# Patient Record
Sex: Male | Born: 1980 | Hispanic: No | Marital: Single | Smoking: Current every day smoker
Health system: Southern US, Community
[De-identification: ages and names within clinical notes are randomized; demographics above are authoritative.]

## PROBLEM LIST (undated history)

## (undated) HISTORY — PX: KNEE SURGERY: SHX244

---

## 2018-12-18 ENCOUNTER — Emergency Department (HOSPITAL_COMMUNITY)
Admission: EM | Admit: 2018-12-18 | Discharge: 2018-12-18 | Disposition: A | Discharge status: Home / Self Care | Payer: No Typology Code available for payment source | Attending: Emergency Medicine | Admitting: Emergency Medicine

## 2018-12-18 ENCOUNTER — Encounter (HOSPITAL_COMMUNITY): Payer: Self-pay

## 2018-12-18 ENCOUNTER — Other Ambulatory Visit: Payer: Self-pay

## 2018-12-18 ENCOUNTER — Emergency Department (HOSPITAL_COMMUNITY): Payer: No Typology Code available for payment source

## 2018-12-18 DIAGNOSIS — S39012A Strain of muscle, fascia and tendon of lower back, initial encounter: Secondary | ICD-10-CM

## 2018-12-18 DIAGNOSIS — F17210 Nicotine dependence, cigarettes, uncomplicated: Secondary | ICD-10-CM | POA: Insufficient documentation

## 2018-12-18 DIAGNOSIS — S3992XA Unspecified injury of lower back, initial encounter: Secondary | ICD-10-CM | POA: Diagnosis present

## 2018-12-18 DIAGNOSIS — M5441 Lumbago with sciatica, right side: Secondary | ICD-10-CM | POA: Diagnosis not present

## 2018-12-18 DIAGNOSIS — M5431 Sciatica, right side: Secondary | ICD-10-CM

## 2018-12-18 DIAGNOSIS — Y929 Unspecified place or not applicable: Secondary | ICD-10-CM | POA: Insufficient documentation

## 2018-12-18 DIAGNOSIS — W010XXA Fall on same level from slipping, tripping and stumbling without subsequent striking against object, initial encounter: Secondary | ICD-10-CM | POA: Insufficient documentation

## 2018-12-18 DIAGNOSIS — Y9389 Activity, other specified: Secondary | ICD-10-CM | POA: Insufficient documentation

## 2018-12-18 DIAGNOSIS — Y990 Civilian activity done for income or pay: Secondary | ICD-10-CM | POA: Diagnosis not present

## 2018-12-18 MED ORDER — DEXAMETHASONE SODIUM PHOSPHATE 10 MG/ML IJ SOLN
10.0000 mg | Freq: Once | INTRAMUSCULAR | Status: AC
  Administered 2018-12-18: 10 mg via INTRAMUSCULAR
  Filled 2018-12-18: qty 1

## 2018-12-18 MED ORDER — KETOROLAC TROMETHAMINE 60 MG/2ML IM SOLN
60.0000 mg | Freq: Once | INTRAMUSCULAR | Status: AC
  Administered 2018-12-18: 60 mg via INTRAMUSCULAR
  Filled 2018-12-18: qty 2

## 2018-12-18 MED ORDER — PREDNISONE 50 MG PO TABS: 50.0000 mg | ORAL_TABLET | Freq: Every day | ORAL | 0 refills | Status: DC

## 2018-12-18 MED ORDER — HYDROCODONE-ACETAMINOPHEN 5-325 MG PO TABS
1.0000 | ORAL_TABLET | Freq: Once | ORAL | Status: AC
  Administered 2018-12-18: 1 via ORAL
  Filled 2018-12-18: qty 1

## 2018-12-18 MED ORDER — CYCLOBENZAPRINE HCL 10 MG PO TABS: 10.0000 mg | ORAL_TABLET | Freq: Every day | ORAL | 0 refills | Status: AC

## 2018-12-18 MED ORDER — TRAMADOL HCL 50 MG PO TABS: 50.0000 mg | ORAL_TABLET | Freq: Four times a day (QID) | ORAL | 0 refills | Status: DC | PRN

## 2018-12-18 NOTE — Discharge Instructions (Signed)
Return here as needed.  You will need to follow-up with your Workmen's Comp. for further outpatient follow-up.  Your x-rays did not show any abnormalities at this time.  Use ice and heat on your lower back.

## 2018-12-18 NOTE — ED Triage Notes (Addendum)
Patient c/o mid lower back pain x 1 week ago. Patient states he had lifted 2 big packages and stepped on a piece of cardboard, hit his neck on a post and fell on his back. Pain radiates into the right buttock. Patient states when he lifts his left leg, the pain is worse.

## 2018-12-18 NOTE — ED Provider Notes (Signed)
[facility]-EMERGENCY DEPT Provider Note   CSN: [id number] Arrival date & time: 12/18/18  1028     History   Chief Complaint Chief Complaint  Patient presents with  . Back Pain    HPI Brian Jarvis is a 38 y.o. male.     HPI Patient presents to the emergency department with lower back pain that started 1 week ago after slipping and falling at work.  The patient states he was carrying 2 heavy boxes when he slipped on a piece of cardboard.  Patient states that he is having pain in the right lower back that radiates into the right leg.  She states certain movements and palpation make the pain worse.  The patient states he did not take any medications prior to arrival for his symptoms.  Patient denies any numbness, weakness, dizziness, headache, blurred vision, lower extremity edema, near-syncope or syncope.  She denies any other injuries at this time. History reviewed. No pertinent past medical history.  There are no active problems to display for this patient.   Past Surgical History:  Procedure Laterality Date  . KNEE SURGERY Right         Home Medications    Prior to Admission medications   Not on File    Family History Family History  Family history unknown: Yes    Social History Social History   Tobacco Use  . Smoking status: Current Every Day Smoker    Packs/day: 0.25    Types: Cigarettes  . Smokeless tobacco: Never Used  Substance Use Topics  . Alcohol use: Yes  . Drug use: Never     Allergies   Patient has no known allergies.   Review of Systems Review of Systems All other systems negative except as documented in the HPI. All pertinent positives and negatives as reviewed in the HPI. Physical Exam Updated Vital Signs BP (!) 123/91 (BP Location: Left Arm)   Pulse 71   Temp 98.2 F (36.8 C) (Oral)   Resp 18   Ht 5\' 9"  (1.753 m)   Wt 97.5 kg   SpO2 100%   BMI 31.75 kg/m   Physical Exam Vitals signs and nursing  note reviewed.  Constitutional:      General: He is not in acute distress.    Appearance: He is well-developed.  HENT:     Head: Normocephalic and atraumatic.  Eyes:     Pupils: Pupils are equal, round, and reactive to light.  Neck:     Musculoskeletal: Normal range of motion and neck supple.  Cardiovascular:     Rate and Rhythm: Normal rate and regular rhythm.     Heart sounds: Normal heart sounds. No murmur. No friction rub. No gallop.   Pulmonary:     Effort: Pulmonary effort is normal. No respiratory distress.     Breath sounds: Normal breath sounds. No wheezing.  Musculoskeletal:     Lumbar back: He exhibits decreased range of motion, tenderness, pain and spasm. He exhibits no bony tenderness, no swelling and no deformity.       Back:  Skin:    General: Skin is warm and dry.     Capillary Refill: Capillary refill takes less than 2 seconds.     Findings: No erythema or rash.  Neurological:     Mental Status: He is alert and oriented to person, place, and time.     Motor: No abnormal muscle tone.     Coordination: Coordination normal.  Psychiatric:  Behavior: Behavior normal.      ED Treatments / Results  Labs (all labs ordered are listed, but only abnormal results are displayed) Labs Reviewed - No data to display  EKG None  Radiology Dg Lumbar Spine Complete  Result Date: 12/18/2018 CLINICAL DATA:  Pain following fall EXAM: LUMBAR SPINE - COMPLETE 4+ VIEW COMPARISON:  None. FINDINGS: Frontal, lateral, spot lumbosacral lateral, and bilateral oblique views were obtained. There are 5 non-rib-bearing lumbar type vertebral bodies. There is no fracture or spondylolisthesis. The disc spaces appear unremarkable. There is no appreciable facet arthropathy. IMPRESSION: No fracture or spondylolisthesis.  No appreciable arthropathy. Electronically Signed   By: Lowella Grip III M.D.   On: 12/18/2018 12:43    Procedures Procedures (including critical care time)   Medications Ordered in ED Medications  ketorolac (TORADOL) injection 60 mg (60 mg Intramuscular Given 12/18/18 1156)  dexamethasone (DECADRON) injection 10 mg (10 mg Intramuscular Given 12/18/18 1156)  HYDROcodone-acetaminophen (NORCO/VICODIN) 5-325 MG per tablet 1 tablet (1 tablet Oral Given 12/18/18 1156)     Initial Impression / Assessment and Plan / ED Course  I have reviewed the triage vital signs and the nursing notes.  Pertinent labs & imaging results that were available during my care of the patient were reviewed by me and considered in my medical decision making (see chart for details).        Patient has normal deep tendon reflexes.  Patient has normal strength and range of motion in his lower extremities.  The patient has normal sensation as well.  I have advised the patient to follow-up with his Workmen's Comp. for further referral.  The patient states that he understands the plan and all questions were answered. Final Clinical Impressions(s) / ED Diagnoses   Final diagnoses:  None    ED Discharge Orders    None       Dalia Heading, PA-C 12/18/18 1300    Lacretia Leigh, MD 12/18/18 1443

## 2019-03-17 ENCOUNTER — Other Ambulatory Visit: Payer: Self-pay | Admitting: Orthopedic Surgery

## 2019-03-18 NOTE — Pre-Procedure Instructions (Addendum)
[id number] - [place], [place] - [address] [place] [address] Phone: [phone] Fax: [phone]     Your procedure is scheduled on Wednesday March 3rd.  Report to [facility] Main Entrance "A" at 9:00 A.M., and check in at the Admitting office.  Call this number if you have problems the morning of surgery:  [phone]  Call [phone] if you have any questions prior to your surgery date Monday-Friday 8am-4pm    Remember:  Do not eat after midnight the night before your surgery  You may drink clear liquids until 9:00 AM the morning of your surgery.   Clear liquids allowed are: Water, Non-Citrus Juices (without pulp), Carbonated Beverages, Clear Tea, Black Coffee Only, and Gatorade Patient Instructions  . The night before surgery:  o No food after midnight. ONLY clear liquids after midnight  . The day of surgery (if you do NOT have diabetes):  o Drink ONE (1) Pre-Surgery Clear Ensure as directed.   o This drink was given to you during your hospital  pre-op appointment visit. o The pre-op nurse will instruct you on the time to drink the Pre-Surgery Ensure depending on your surgery time-- please finish by 9: 00am. Drink all in one sitting, do not sip.  o Finish the drink at the designated time by the pre-op nurse.  o Nothing else to drink after completing the  Pre-Surgery Clear Ensure.         If you have questions, please contact your surgeon's office.     Take these medicines the morning of surgery with A SIP OF WATER-NONE  As of today, STOP taking any Aspirin (unless otherwise instructed by your surgeon), Aleve, Naproxen, Ibuprofen, Motrin, Advil, Goody's, BC's, all herbal medications, fish oil, and all vitamins.    The Morning of Surgery  Do not wear jewelry, make-up or nail polish.  Do not wear lotions, powders, or perfumes/colognes, or deodorant  Do not shave 48 hours prior to surgery.  Men may shave face and  neck.  Do not bring valuables to the hospital.  [facility] is not responsible for any belongings or valuables.  If you are a smoker, DO NOT Smoke 24 hours prior to surgery  If you wear a CPAP at night please bring your mask the morning of surgery   Remember that you must have someone to transport you home after your surgery, and remain with you for 24 hours if you are discharged the same day.   Please bring cases for contacts, glasses, hearing aids, dentures or bridgework because it cannot be worn into surgery.    Leave your suitcase in the car.  After surgery it may be brought to your room.  For patients admitted to the hospital, discharge time will be determined by your treatment team.  Patients discharged the day of surgery will not be allowed to drive home.    Special instructions:   [facility]- Preparing For Surgery  Before surgery, you can play an important role. Because skin is not sterile, your skin needs to be as free of germs as possible. You can reduce the number of germs on your skin by washing with CHG (chlorahexidine gluconate) Soap before surgery.  CHG is an antiseptic cleaner which kills germs and bonds with the skin to continue killing germs even after washing.    Oral Hygiene is also important to reduce your risk of infection.  Remember - BRUSH YOUR TEETH THE MORNING  OF SURGERY WITH YOUR REGULAR TOOTHPASTE  Please do not use if you have an allergy to CHG or antibacterial soaps. If your skin becomes reddened/irritated stop using the CHG.  Do not shave (including legs and underarms) for at least 48 hours prior to first CHG shower. It is OK to shave your face.  Please follow these instructions carefully.   1. Shower the NIGHT BEFORE SURGERY and the MORNING OF SURGERY with CHG Soap.   2. If you chose to wash your hair, wash your hair first as usual with your normal shampoo.  3. After you shampoo, rinse your hair and body thoroughly to remove the shampoo.  4. Use  CHG as you would any other liquid soap. You can apply CHG directly to the skin and wash gently with a scrungie or a clean washcloth.   5. Apply the CHG Soap to your body ONLY FROM THE NECK DOWN.  Do not use on open wounds or open sores. Avoid contact with your eyes, ears, mouth and genitals (private parts). Wash Face and genitals (private parts)  with your normal soap.   6. Wash thoroughly, paying special attention to the area where your surgery will be performed.  7. Thoroughly rinse your body with warm water from the neck down.  8. DO NOT shower/wash with your normal soap after using and rinsing off the CHG Soap.  9. Pat yourself dry with a CLEAN TOWEL.  10. Wear CLEAN PAJAMAS to bed the night before surgery, wear comfortable clothes the morning of surgery  11. Place CLEAN SHEETS on your bed the night of your first shower and DO NOT SLEEP WITH PETS.    Day of Surgery:  Please shower the morning of surgery with the CHG soap Do not apply any deodorants/lotions. Please wear clean clothes to the hospital/surgery center.   Remember to brush your teeth WITH YOUR REGULAR TOOTHPASTE.   Please read over the following fact sheets that you were given.

## 2019-03-21 ENCOUNTER — Other Ambulatory Visit (HOSPITAL_COMMUNITY)
Admission: RE | Admit: 2019-03-21 | Discharge: 2019-03-21 | Disposition: A | Discharge status: Home / Self Care | Payer: HRSA Program | Source: Ambulatory Visit | Attending: Orthopedic Surgery | Admitting: Orthopedic Surgery

## 2019-03-21 ENCOUNTER — Encounter (HOSPITAL_COMMUNITY)
Admission: RE | Admit: 2019-03-21 | Discharge: 2019-03-21 | Disposition: A | Discharge status: Home / Self Care | Payer: Self-pay | Source: Ambulatory Visit | Attending: Orthopedic Surgery | Admitting: Orthopedic Surgery

## 2019-03-21 ENCOUNTER — Encounter (HOSPITAL_COMMUNITY): Payer: Self-pay

## 2019-03-21 ENCOUNTER — Other Ambulatory Visit: Payer: Self-pay

## 2019-03-21 DIAGNOSIS — Z01812 Encounter for preprocedural laboratory examination: Secondary | ICD-10-CM | POA: Insufficient documentation

## 2019-03-21 DIAGNOSIS — Z20822 Contact with and (suspected) exposure to covid-19: Secondary | ICD-10-CM | POA: Insufficient documentation

## 2019-03-21 LAB — COMPREHENSIVE METABOLIC PANEL
ALT: 25 U/L (ref 0–44)
AST: 26 U/L (ref 15–41)
Albumin: 4.4 g/dL (ref 3.5–5.0)
Alkaline Phosphatase: 58 U/L (ref 38–126)
Anion gap: 10 (ref 5–15)
BUN: 9 mg/dL (ref 6–20)
CO2: 21 mmol/L — ABNORMAL LOW (ref 22–32)
Calcium: 9.3 mg/dL (ref 8.9–10.3)
Chloride: 107 mmol/L (ref 98–111)
Creatinine, Ser: 0.92 mg/dL (ref 0.61–1.24)
GFR calc Af Amer: >60 mL/min (ref >60)
GFR calc non Af Amer: >60 mL/min (ref >60)
Glucose, Bld: 130 mg/dL — ABNORMAL HIGH (ref 70–99)
Potassium: 3.9 mmol/L (ref 3.5–5.1)
Sodium: 138 mmol/L (ref 135–145)
Total Bilirubin: 0.3 mg/dL (ref 0.3–1.2)
Total Protein: 7 g/dL (ref 6.5–8.1)

## 2019-03-21 LAB — CBC WITH DIFFERENTIAL/PLATELET
Abs Immature Granulocytes: 0.02 10*3/uL (ref 0.00–0.07)
Basophils Absolute: 0 10*3/uL (ref 0.0–0.1)
Basophils Relative: 0 %
Eosinophils Absolute: 0.4 10*3/uL (ref 0.0–0.5)
Eosinophils Relative: 5 %
HCT: 50.5 % (ref 39.0–52.0)
Hemoglobin: 16.3 g/dL (ref 13.0–17.0)
Immature Granulocytes: 0 %
Lymphocytes Relative: 34 %
Lymphs Abs: 2.5 10*3/uL (ref 0.7–4.0)
MCH: 29.7 pg (ref 26.0–34.0)
MCHC: 32.3 g/dL (ref 30.0–36.0)
MCV: 92 fL (ref 80.0–100.0)
Monocytes Absolute: 0.7 10*3/uL (ref 0.1–1.0)
Monocytes Relative: 9 %
Neutro Abs: 3.6 10*3/uL (ref 1.7–7.7)
Neutrophils Relative %: 52 %
Platelets: 234 10*3/uL (ref 150–400)
RBC: 5.49 MIL/uL (ref 4.22–5.81)
RDW: 13.5 % (ref 11.5–15.5)
WBC: 7.1 10*3/uL (ref 4.0–10.5)
nRBC: 0 % (ref 0.0–0.2)

## 2019-03-21 LAB — URINALYSIS, ROUTINE W REFLEX MICROSCOPIC
Bilirubin Urine: NEGATIVE
Glucose, UA: NEGATIVE mg/dL
Hgb urine dipstick: NEGATIVE
Ketones, ur: NEGATIVE mg/dL
Leukocytes,Ua: NEGATIVE
Nitrite: NEGATIVE
Protein, ur: NEGATIVE mg/dL
Specific Gravity, Urine: 1.016 (ref 1.005–1.030)
pH: 5 (ref 5.0–8.0)

## 2019-03-21 LAB — TYPE AND SCREEN
ABO/RH(D): O POS
Antibody Screen: NEGATIVE

## 2019-03-21 LAB — APTT: aPTT: 28 seconds (ref 24–36)

## 2019-03-21 LAB — SURGICAL PCR SCREEN
MRSA, PCR: NEGATIVE
Staphylococcus aureus: NEGATIVE

## 2019-03-21 LAB — ABO/RH: ABO/RH(D): O POS

## 2019-03-21 LAB — PROTIME-INR
INR: 0.9 (ref 0.8–1.2)
Prothrombin Time: 12.5 seconds (ref 11.4–15.2)

## 2019-03-21 NOTE — Progress Notes (Signed)
[id number]- attempted to contact patient regarding 0900 appointment. Left message to return call.  [id number]- patient arrived to admitting for 0900 PAT appointment

## 2019-03-21 NOTE — Progress Notes (Signed)
PCP -denies Cardiologist - denies  Chest x-ray - N/A EKG - N/A Stress Test - denies ECHO - denies Cardiac Cath - denies  Sleep Study - denies  Aspirin Instructions: Patient instructed to hold all Aspirin, NSAID's, herbal medications, fish oil and vitamins 7 days prior to surgery.  Covid: 03/21/19 at [facility]. Pt instructed to remain in their car. Educated on Haematologist until [employer]   Anesthesia review:   Patient denies shortness of breath, fever, cough and chest pain at PAT appointment   Patient verbalized understanding of instructions that were given to them at the PAT appointment. Patient was also instructed that they will need to review over the PAT instructions again at home before surgery.

## 2019-03-22 LAB — SARS CORONAVIRUS 2 (TAT 6-24 HRS): SARS Coronavirus 2: NEGATIVE

## 2019-03-23 ENCOUNTER — Ambulatory Visit (HOSPITAL_COMMUNITY): Payer: No Typology Code available for payment source | Admitting: Anesthesiology

## 2019-03-23 ENCOUNTER — Other Ambulatory Visit: Payer: Self-pay

## 2019-03-23 ENCOUNTER — Ambulatory Visit (HOSPITAL_COMMUNITY)
Admission: RE | Disposition: A | Discharge status: Home / Self Care | Payer: Self-pay | Source: Home / Self Care | Attending: Orthopedic Surgery

## 2019-03-23 ENCOUNTER — Ambulatory Visit (HOSPITAL_COMMUNITY): Payer: No Typology Code available for payment source

## 2019-03-23 ENCOUNTER — Ambulatory Visit (HOSPITAL_COMMUNITY)
Admission: RE | Admit: 2019-03-23 | Discharge: 2019-03-23 | Disposition: A | Discharge status: Home / Self Care | Payer: No Typology Code available for payment source | Attending: Orthopedic Surgery | Admitting: Orthopedic Surgery

## 2019-03-23 ENCOUNTER — Encounter (HOSPITAL_COMMUNITY): Payer: Self-pay | Admitting: Orthopedic Surgery

## 2019-03-23 DIAGNOSIS — F17210 Nicotine dependence, cigarettes, uncomplicated: Secondary | ICD-10-CM | POA: Insufficient documentation

## 2019-03-23 DIAGNOSIS — M79605 Pain in left leg: Secondary | ICD-10-CM | POA: Diagnosis present

## 2019-03-23 DIAGNOSIS — M5117 Intervertebral disc disorders with radiculopathy, lumbosacral region: Secondary | ICD-10-CM | POA: Diagnosis not present

## 2019-03-23 DIAGNOSIS — Z419 Encounter for procedure for purposes other than remedying health state, unspecified: Secondary | ICD-10-CM

## 2019-03-23 HISTORY — PX: LUMBAR LAMINECTOMY/DECOMPRESSION MICRODISCECTOMY: SHX5026

## 2019-03-23 SURGERY — LUMBAR LAMINECTOMY/DECOMPRESSION MICRODISCECTOMY
Anesthesia: General | Laterality: Left

## 2019-03-23 MED ORDER — LIDOCAINE 2% (20 MG/ML) 5 ML SYRINGE
INTRAMUSCULAR | Status: AC
  Filled 2019-03-23: qty 5

## 2019-03-23 MED ORDER — POVIDONE-IODINE 7.5 % EX SOLN: Freq: Once | CUTANEOUS | Status: DC

## 2019-03-23 MED ORDER — HYDROMORPHONE HCL 1 MG/ML IJ SOLN
0.2500 mg | INTRAMUSCULAR | Status: DC | PRN
  Administered 2019-03-23: 0.5 mg via INTRAVENOUS

## 2019-03-23 MED ORDER — PHENYLEPHRINE 40 MCG/ML (10ML) SYRINGE FOR IV PUSH (FOR BLOOD PRESSURE SUPPORT)
PREFILLED_SYRINGE | INTRAVENOUS | Status: DC | PRN
  Administered 2019-03-23 (×2): 40 ug via INTRAVENOUS

## 2019-03-23 MED ORDER — SUGAMMADEX SODIUM 200 MG/2ML IV SOLN
INTRAVENOUS | Status: DC | PRN
  Administered 2019-03-23: 220 mg via INTRAVENOUS

## 2019-03-23 MED ORDER — HYDROMORPHONE HCL 1 MG/ML IJ SOLN
INTRAMUSCULAR | Status: AC
  Filled 2019-03-23: qty 0.5

## 2019-03-23 MED ORDER — PROPOFOL 10 MG/ML IV BOLUS
INTRAVENOUS | Status: AC
  Filled 2019-03-23: qty 20

## 2019-03-23 MED ORDER — ROCURONIUM BROMIDE 10 MG/ML (PF) SYRINGE
PREFILLED_SYRINGE | INTRAVENOUS | Status: AC
  Filled 2019-03-23: qty 10

## 2019-03-23 MED ORDER — BUPIVACAINE LIPOSOME 1.3 % IJ SUSP
INTRAMUSCULAR | Status: DC | PRN
  Administered 2019-03-23: 10 mL

## 2019-03-23 MED ORDER — MIDAZOLAM HCL 5 MG/5ML IJ SOLN
INTRAMUSCULAR | Status: DC | PRN
  Administered 2019-03-23: 2 mg via INTRAVENOUS

## 2019-03-23 MED ORDER — BUPIVACAINE LIPOSOME 1.3 % IJ SUSP
20.0000 mL | Freq: Once | INTRAMUSCULAR | Status: DC
  Filled 2019-03-23: qty 20

## 2019-03-23 MED ORDER — BUPIVACAINE-EPINEPHRINE 0.25% -1:200000 IJ SOLN
INTRAMUSCULAR | Status: DC | PRN
  Administered 2019-03-23: 6 mL
  Administered 2019-03-23: 10 mL

## 2019-03-23 MED ORDER — BUPIVACAINE HCL (PF) 0.25 % IJ SOLN
INTRAMUSCULAR | Status: AC
  Filled 2019-03-23: qty 30

## 2019-03-23 MED ORDER — ONDANSETRON HCL 4 MG/2ML IJ SOLN
INTRAMUSCULAR | Status: DC | PRN
  Administered 2019-03-23: 4 mg via INTRAVENOUS

## 2019-03-23 MED ORDER — ONDANSETRON HCL 4 MG/2ML IJ SOLN
INTRAMUSCULAR | Status: AC
  Filled 2019-03-23: qty 2

## 2019-03-23 MED ORDER — METHYLPREDNISOLONE ACETATE 40 MG/ML IJ SUSP
INTRAMUSCULAR | Status: AC
  Filled 2019-03-23: qty 1

## 2019-03-23 MED ORDER — ROCURONIUM BROMIDE 10 MG/ML (PF) SYRINGE
PREFILLED_SYRINGE | INTRAVENOUS | Status: DC | PRN
  Administered 2019-03-23: 10 mg via INTRAVENOUS
  Administered 2019-03-23: 30 mg via INTRAVENOUS
  Administered 2019-03-23: 50 mg via INTRAVENOUS
  Administered 2019-03-23: 10 mg via INTRAVENOUS

## 2019-03-23 MED ORDER — DEXAMETHASONE SODIUM PHOSPHATE 10 MG/ML IJ SOLN
INTRAMUSCULAR | Status: DC | PRN
  Administered 2019-03-23: 4 mg via INTRAVENOUS

## 2019-03-23 MED ORDER — 0.9 % SODIUM CHLORIDE (POUR BTL) OPTIME
TOPICAL | Status: DC | PRN
  Administered 2019-03-23: 1000 mL

## 2019-03-23 MED ORDER — FENTANYL CITRATE (PF) 250 MCG/5ML IJ SOLN
INTRAMUSCULAR | Status: DC | PRN
  Administered 2019-03-23 (×2): 100 ug via INTRAVENOUS
  Administered 2019-03-23: 50 ug via INTRAVENOUS

## 2019-03-23 MED ORDER — FENTANYL CITRATE (PF) 250 MCG/5ML IJ SOLN
INTRAMUSCULAR | Status: AC
  Filled 2019-03-23: qty 5

## 2019-03-23 MED ORDER — LIDOCAINE 2% (20 MG/ML) 5 ML SYRINGE
INTRAMUSCULAR | Status: DC | PRN
  Administered 2019-03-23: 60 mg via INTRAVENOUS

## 2019-03-23 MED ORDER — OXYCODONE-ACETAMINOPHEN 5-325 MG PO TABS: 1.0000 | ORAL_TABLET | ORAL | 0 refills | Status: AC | PRN

## 2019-03-23 MED ORDER — HYDROMORPHONE HCL 1 MG/ML IJ SOLN
INTRAMUSCULAR | Status: DC | PRN
  Administered 2019-03-23 (×2): .5 mg via INTRAVENOUS

## 2019-03-23 MED ORDER — ACETAMINOPHEN 500 MG PO TABS
1000.0000 mg | ORAL_TABLET | Freq: Once | ORAL | Status: AC
  Administered 2019-03-23: 1000 mg via ORAL
  Filled 2019-03-23 (×2): qty 2

## 2019-03-23 MED ORDER — LACTATED RINGERS IV SOLN: INTRAVENOUS | Status: DC

## 2019-03-23 MED ORDER — THROMBIN 20000 UNITS EX SOLR
CUTANEOUS | Status: DC | PRN
  Administered 2019-03-23: 20 mL via TOPICAL

## 2019-03-23 MED ORDER — PROPOFOL 10 MG/ML IV BOLUS
INTRAVENOUS | Status: DC | PRN
  Administered 2019-03-23: 150 mg via INTRAVENOUS

## 2019-03-23 MED ORDER — HEMOSTATIC AGENTS (NO CHARGE) OPTIME
TOPICAL | Status: DC | PRN
  Administered 2019-03-23: 1 via TOPICAL

## 2019-03-23 MED ORDER — DEXAMETHASONE SODIUM PHOSPHATE 10 MG/ML IJ SOLN
INTRAMUSCULAR | Status: AC
  Filled 2019-03-23: qty 1

## 2019-03-23 MED ORDER — METHOCARBAMOL 500 MG PO TABS: 500.0000 mg | ORAL_TABLET | Freq: Four times a day (QID) | ORAL | 0 refills | Status: DC | PRN

## 2019-03-23 MED ORDER — MIDAZOLAM HCL 2 MG/2ML IJ SOLN
INTRAMUSCULAR | Status: AC
  Filled 2019-03-23: qty 2

## 2019-03-23 MED ORDER — METHYLENE BLUE 0.5 % INJ SOLN
INTRAVENOUS | Status: DC | PRN
  Administered 2019-03-23: .3 mL via SUBMUCOSAL

## 2019-03-23 MED ORDER — METHYLENE BLUE 0.5 % INJ SOLN
INTRAVENOUS | Status: AC
  Filled 2019-03-23: qty 10

## 2019-03-23 MED ORDER — METHYLPREDNISOLONE ACETATE 40 MG/ML IJ SUSP
INTRAMUSCULAR | Status: DC | PRN
  Administered 2019-03-23: 40 mg via INTRA_ARTICULAR

## 2019-03-23 MED ORDER — LACTATED RINGERS IV SOLN: INTRAVENOUS | Status: DC | PRN

## 2019-03-23 MED ORDER — CEFAZOLIN SODIUM-DEXTROSE 2-4 GM/100ML-% IV SOLN
2.0000 g | INTRAVENOUS | Status: AC
  Administered 2019-03-23: 12:00:00 2 g via INTRAVENOUS
  Filled 2019-03-23: qty 100

## 2019-03-23 MED ORDER — HYDROMORPHONE HCL 1 MG/ML IJ SOLN
INTRAMUSCULAR | Status: AC
  Filled 2019-03-23: qty 1

## 2019-03-23 MED ORDER — THROMBIN 20000 UNITS EX KIT
PACK | CUTANEOUS | Status: AC
  Filled 2019-03-23: qty 1

## 2019-03-23 MED ORDER — EPINEPHRINE PF 1 MG/ML IJ SOLN
INTRAMUSCULAR | Status: AC
  Filled 2019-03-23: qty 1

## 2019-03-23 SURGICAL SUPPLY — 70 items
BENZOIN TINCTURE PRP APPL 2/3 (GAUZE/BANDAGES/DRESSINGS) ×2 IMPLANT
BUR PRECISION FLUTE 5.0 (BURR) IMPLANT
BUR ROUND FLUTED 4 SOFT TCH (BURR) ×2 IMPLANT
CABLE BIPOLOR RESECTION CORD (MISCELLANEOUS) ×2 IMPLANT
CANISTER SUCT 3000ML PPV (MISCELLANEOUS) ×2 IMPLANT
CARTRIDGE OIL MAESTRO DRILL (MISCELLANEOUS) ×1 IMPLANT
COVER SURGICAL LIGHT HANDLE (MISCELLANEOUS) ×2 IMPLANT
COVER WAND RF STERILE (DRAPES) ×2 IMPLANT
DIFFUSER DRILL AIR PNEUMATIC (MISCELLANEOUS) ×2 IMPLANT
DRAIN CHANNEL 15F RND FF W/TCR (WOUND CARE) IMPLANT
DRAPE POUCH INSTRU U-SHP 10X18 (DRAPES) ×4 IMPLANT
DRAPE SURG 17X23 STRL (DRAPES) ×8 IMPLANT
DURAPREP 26ML APPLICATOR (WOUND CARE) ×2 IMPLANT
ELECT BLADE 4.0 EZ CLEAN MEGAD (MISCELLANEOUS) ×2
ELECT CAUTERY BLADE 6.4 (BLADE) ×2 IMPLANT
ELECT REM PT RETURN 9FT ADLT (ELECTROSURGICAL) ×2
ELECTRODE BLDE 4.0 EZ CLN MEGD (MISCELLANEOUS) ×1 IMPLANT
ELECTRODE REM PT RTRN 9FT ADLT (ELECTROSURGICAL) ×1 IMPLANT
EVACUATOR SILICONE 100CC (DRAIN) IMPLANT
FILTER STRAW FLUID ASPIR (MISCELLANEOUS) ×2 IMPLANT
GAUZE 4X4 16PLY RFD (DISPOSABLE) ×4 IMPLANT
GAUZE SPONGE 4X4 12PLY STRL (GAUZE/BANDAGES/DRESSINGS) ×2 IMPLANT
GLOVE BIO SURGEON STRL SZ7 (GLOVE) ×2 IMPLANT
GLOVE BIO SURGEON STRL SZ8 (GLOVE) ×2 IMPLANT
GLOVE BIOGEL PI IND STRL 7.0 (GLOVE) ×1 IMPLANT
GLOVE BIOGEL PI IND STRL 8 (GLOVE) ×1 IMPLANT
GLOVE BIOGEL PI INDICATOR 7.0 (GLOVE) ×1
GLOVE BIOGEL PI INDICATOR 8 (GLOVE) ×1
GOWN STRL REUS W/ TWL LRG LVL3 (GOWN DISPOSABLE) ×1 IMPLANT
GOWN STRL REUS W/ TWL XL LVL3 (GOWN DISPOSABLE) ×2 IMPLANT
GOWN STRL REUS W/TWL LRG LVL3 (GOWN DISPOSABLE) ×1
GOWN STRL REUS W/TWL XL LVL3 (GOWN DISPOSABLE) ×2
IV CATH 14GX2 1/4 (CATHETERS) ×2 IMPLANT
KIT BASIN OR (CUSTOM PROCEDURE TRAY) ×2 IMPLANT
KIT POSITION SURG JACKSON T1 (MISCELLANEOUS) ×2 IMPLANT
KIT TURNOVER KIT B (KITS) ×2 IMPLANT
NEEDLE 18GX1X1/2 (RX/OR ONLY) (NEEDLE) ×2 IMPLANT
NEEDLE 22X1 1/2 (OR ONLY) (NEEDLE) ×2 IMPLANT
NEEDLE HYPO 25GX1X1/2 BEV (NEEDLE) ×2 IMPLANT
NEEDLE SPNL 18GX3.5 QUINCKE PK (NEEDLE) ×4 IMPLANT
NS IRRIG 1000ML POUR BTL (IV SOLUTION) ×2 IMPLANT
OIL CARTRIDGE MAESTRO DRILL (MISCELLANEOUS) ×2
PACK LAMINECTOMY ORTHO (CUSTOM PROCEDURE TRAY) ×2 IMPLANT
PACK UNIVERSAL I (CUSTOM PROCEDURE TRAY) ×2 IMPLANT
PAD ARMBOARD 7.5X6 YLW CONV (MISCELLANEOUS) ×4 IMPLANT
PATTIES SURGICAL .5 X.5 (GAUZE/BANDAGES/DRESSINGS) IMPLANT
PATTIES SURGICAL .5 X1 (DISPOSABLE) ×2 IMPLANT
SPONGE INTESTINAL PEANUT (DISPOSABLE) ×2 IMPLANT
SPONGE SURGIFOAM ABS GEL 100 (HEMOSTASIS) ×2 IMPLANT
SPONGE SURGIFOAM ABS GEL SZ50 (HEMOSTASIS) ×2 IMPLANT
STRIP CLOSURE SKIN 1/2X4 (GAUZE/BANDAGES/DRESSINGS) ×2 IMPLANT
SURGIFLO W/THROMBIN 8M KIT (HEMOSTASIS) ×2 IMPLANT
SUT MNCRL AB 4-0 PS2 18 (SUTURE) ×2 IMPLANT
SUT VIC AB 0 CT1 18XCR BRD 8 (SUTURE) IMPLANT
SUT VIC AB 0 CT1 27 (SUTURE)
SUT VIC AB 0 CT1 27XBRD ANBCTR (SUTURE) IMPLANT
SUT VIC AB 0 CT1 8-18 (SUTURE)
SUT VIC AB 1 CT1 18XCR BRD 8 (SUTURE) ×1 IMPLANT
SUT VIC AB 1 CT1 8-18 (SUTURE) ×1
SUT VIC AB 2-0 CT2 18 VCP726D (SUTURE) ×2 IMPLANT
SYR 20ML LL LF (SYRINGE) ×2 IMPLANT
SYR BULB IRRIGATION 50ML (SYRINGE) ×2 IMPLANT
SYR CONTROL 10ML LL (SYRINGE) ×4 IMPLANT
SYR TB 1ML 25GX5/8 (SYRINGE) ×4 IMPLANT
SYR TB 1ML LUER SLIP (SYRINGE) ×4 IMPLANT
TAPE CLOTH SURG 4X10 WHT LF (GAUZE/BANDAGES/DRESSINGS) ×2 IMPLANT
TOWEL GREEN STERILE (TOWEL DISPOSABLE) ×2 IMPLANT
TOWEL GREEN STERILE FF (TOWEL DISPOSABLE) ×2 IMPLANT
WATER STERILE IRR 1000ML POUR (IV SOLUTION) ×2 IMPLANT
YANKAUER SUCT BULB TIP NO VENT (SUCTIONS) ×2 IMPLANT

## 2019-03-23 NOTE — Transfer of Care (Signed)
Immediate Anesthesia Transfer of Care Note  Patient: Brian Jarvis  Procedure(s) Performed: LEFT - SIDED LUMBAR 5 - SACRUM 1 MICRODISECTOMY (Left )  Patient Location: PACU  Anesthesia Type:General  Level of Consciousness: awake, alert  and oriented  Airway & Oxygen Therapy: Patient Spontanous Breathing  Post-op Assessment: Report given to RN, Post -op Vital signs reviewed and stable and Patient moving all extremities  Post vital signs: Reviewed and stable  Last Vitals:  Vitals Value Taken Time  BP 135/93 03/23/19 1501  Temp    Pulse 81 03/23/19 1503  Resp 14 03/23/19 1503  SpO2 100 % 03/23/19 1503  Vitals shown include unvalidated device data.  Last Pain:  Vitals:   03/23/19 0931  TempSrc: Oral  PainSc:       Patients Stated Pain Goal: 3 (03/23/19 0920)  Complications: No apparent anesthesia complications

## 2019-03-23 NOTE — Op Note (Signed)
PATIENT NAME: Brian Jarvis   MEDICAL RECORD NO.:   [id number]   DATE OF BIRTH: 12-29-1980   DATE OF PROCEDURE: 03/23/2019                              OPERATIVE REPORT     PREOPERATIVE DIAGNOSES: 1. Left-sided S1 radiculopathy. 2. Large left-sided L5-S1 disk herniation causing severe     compression of the left S1 nerve.   POSTOPERATIVE DIAGNOSES: 1. Left-sided S1 radiculopathy. 2. Large left-sided L5-S1 disk herniation causing severe     compression of the left S1 nerve.   PROCEDURES:  Left-sided L5-S1 laminotomy with partial facetectomy and removal of large herniated left-sided L5-S1 disk fragment.   SURGEON:  Estill Bamberg, MD.   ASSISTANTJason Coop, PA-C.   ANESTHESIA:  General endotracheal anesthesia.   COMPLICATIONS:  None.   DISPOSITION:  Stable.   ESTIMATED BLOOD LOSS:  Minimal.   INDICATIONS FOR SURGERY:  Briefly, Brian Jarvis is a pleasant 39 year old male who did present to me with severe pain in the left leg.  The patient's MRI did reveal the findings outlined above, clearly notable for a large herniated disk fragment. The pain was rather severe.  We did discuss treatment options and we did ultimately elect to proceed with the procedure reflected above.  The patient was fully made aware of the risks of surgery, including the risk of recurrent herniation and the need for subsequent surgery, including the possibility of a subsequent diskectomy and/or fusion.   OPERATIVE DETAILS:  On 03/23/2019, the patient was brought to surgery and general endotracheal anesthesia was administered.  The patient was placed prone on a well-padded flat Jackson bed with a spinal frame.  Antibiotics were given.  The back was prepped and draped and a time-out procedure was performed.  At this point, a midline incision was made directly over the L5-S1 intervertebral space.  A curvilinear incision was made just to the left of the midline into the fascia.   A self-retaining McCulloch retractor was placed.  The lamina of L5 and S1 was identified and subperiosteally exposed.  I then removed the lateral aspect of the L5-S1 ligamentum flavum.  Readily identified was the traversing left S1 nerve, which was noted to be under obvious tension, and was noted to be rather erythematous.  I was able to gently gain medial retraction of the nerve, and in doing so, a very large herniated disk fragment was readily noted.  An annulotomy was performed, and multiple disc fragments were removed using various pituitary rongeurs. There were noted to be chronic fragments adherent to the annulus, which were freed using a small epstein curette and subsequently removed.  I was very pleased with the final decompression that I was able to accomplish.  At this point, the wound was copiously irrigated with normal saline.  All epidural bleeding was controlled using bipolar electrocautery in addition to Surgiflo. All bleeding was controlled at the termination of the procedure.  At this point, 20 mg of Depo-Medrol was introduced about the epidural space in the region of the right S1 nerve.  The wound was then closed in layers using #1 Vicryl followed by 0 Vicryl, followed by 4-0 Monocryl. Benzoin and Steri-Strips were applied followed by a sterile dressing. All instrument counts were correct at the termination of the procedure.   Of note, Jason Coop was my assistant throughout surgery, and did aid in retraction, suctioning, and closure from  start to finish.     Phylliss Bob, MD

## 2019-03-23 NOTE — H&P (Signed)
PREOPERATIVE H&P  Chief Complaint: Left leg pain  HPI: Brian Jarvis is a 39 y.o. male who presents with ongoing pain in the left leg  MRI reveals a large left L5/S1 HNP  Patient has failed multiple forms of conservative care and continues to have pain (see office notes for additional details regarding the patient's full course of treatment)  No past medical history on file. Past Surgical History:  Procedure Laterality Date  . KNEE SURGERY Right    Social History   Socioeconomic History  . Marital status: Single    Spouse name: Not on file  . Number of children: Not on file  . Years of education: Not on file  . Highest education level: Not on file  Occupational History  . Not on file  Tobacco Use  . Smoking status: Current Every Day Smoker    Packs/day: 0.25    Types: Cigarettes  . Smokeless tobacco: Never Used  Substance and Sexual Activity  . Alcohol use: Yes    Comment: occassionaly  . Drug use: Never  . Sexual activity: Not on file  Other Topics Concern  . Not on file  Social History Narrative  . Not on file   Social Determinants of Health   Financial Resource Strain:   . Difficulty of Paying Living Expenses: Not on file  Food Insecurity:   . Worried About Charity fundraiser in the Last Year: Not on file  . Ran Out of Food in the Last Year: Not on file  Transportation Needs:   . Lack of Transportation (Medical): Not on file  . Lack of Transportation (Non-Medical): Not on file  Physical Activity:   . Days of Exercise per Week: Not on file  . Minutes of Exercise per Session: Not on file  Stress:   . Feeling of Stress : Not on file  Social Connections:   . Frequency of Communication with Friends and Family: Not on file  . Frequency of Social Gatherings with Friends and Family: Not on file  . Attends Religious Services: Not on file  . Active Member of Clubs or Organizations: Not on file  . Attends Archivist Meetings: Not on file   . Marital Status: Not on file   Family History  Family history unknown: Yes   No Known Allergies Prior to Admission medications   Medication Sig Start Date End Date Taking? Authorizing Provider  cyclobenzaprine (FLEXERIL) 10 MG tablet Take 1 tablet (10 mg total) by mouth at bedtime. Patient not taking: Reported on 03/18/2019 12/18/18   Dalia Heading, PA-C  predniSONE (DELTASONE) 50 MG tablet Take 1 tablet (50 mg total) by mouth daily. Patient not taking: Reported on 03/18/2019 12/18/18   Dalia Heading, PA-C  traMADol (ULTRAM) 50 MG tablet Take 1 tablet (50 mg total) by mouth every 6 (six) hours as needed for severe pain. Patient not taking: Reported on 03/18/2019 12/18/18   Dalia Heading, PA-C     All other systems have been reviewed and were otherwise negative with the exception of those mentioned in the HPI and as above.  Physical Exam: There were no vitals filed for this visit.  There is no height or weight on file to calculate BMI.  General: Alert, no acute distress Cardiovascular: No pedal edema Respiratory: No cyanosis, no use of accessory musculature Skin: No lesions in the area of chief complaint Neurologic: Sensation intact distally Psychiatric: Patient is competent for consent with normal mood and affect Lymphatic: No  axillary or cervical lymphadenopathy  MUSCULOSKELETAL: + SLR on the left  Assessment/Plan: LEFT SACRUM 1 RADICULOPATHY SECONDARY TO LARGE EXTRUDED LUMBAR 5 - SACRUM 1 DISC HERNIATION Plan for Procedure(s): LEFT LUMBAR 5 - SACRUM 1 MICRODISECTOMY   Jackelyn Hoehn, MD 03/23/2019 8:30 AM

## 2019-03-23 NOTE — Anesthesia Procedure Notes (Signed)
Procedure Name: Intubation Date/Time: 03/23/2019 12:16 PM Performed by: Amadeo Garnet, CRNA Pre-anesthesia Checklist: Emergency Drugs available, Patient identified, Suction available and Patient being monitored Patient Re-evaluated:Patient Re-evaluated prior to induction Oxygen Delivery Method: Circle system utilized Preoxygenation: Pre-oxygenation with 100% oxygen Induction Type: IV induction Ventilation: Mask ventilation without difficulty Laryngoscope Size: Mac and 4 Grade View: Grade I Tube type: Oral Tube size: 7.5 mm Number of attempts: 1 Airway Equipment and Method: Stylet Placement Confirmation: ETT inserted through vocal cords under direct vision,  positive ETCO2 and breath sounds checked- equal and bilateral Secured at: 22 cm Tube secured with: Tape Dental Injury: Teeth and Oropharynx as per pre-operative assessment

## 2019-03-23 NOTE — Anesthesia Preprocedure Evaluation (Addendum)
Anesthesia Evaluation  Patient identified by MRN, date of birth, ID band Patient awake    Reviewed: Allergy & Precautions, H&P , NPO status , Patient's Chart, lab work & pertinent test results  Airway Mallampati: II  TM Distance: >3 FB Neck ROM: Full    Dental no notable dental hx. (+) Poor Dentition, Dental Advisory Given   Pulmonary neg pulmonary ROS, Current Smoker and Patient abstained from smoking.,    Pulmonary exam normal breath sounds clear to auscultation       Cardiovascular negative cardio ROS   Rhythm:Regular Rate:Normal     Neuro/Psych negative neurological ROS  negative psych ROS   GI/Hepatic negative GI ROS, Neg liver ROS,   Endo/Other  negative endocrine ROS  Renal/GU negative Renal ROS  negative genitourinary   Musculoskeletal   Abdominal   Peds  Hematology negative hematology ROS (+)   Anesthesia Other Findings   Reproductive/Obstetrics negative OB ROS                            Anesthesia Physical Anesthesia Plan  ASA: II  Anesthesia Plan: General   Post-op Pain Management:    Induction: Intravenous  PONV Risk Score and Plan: 2 and Ondansetron, Dexamethasone and Midazolam  Airway Management Planned: Oral ETT  Additional Equipment:   Intra-op Plan:   Post-operative Plan: Extubation in OR  Informed Consent: I have reviewed the patients History and Physical, chart, labs and discussed the procedure including the risks, benefits and alternatives for the proposed anesthesia with the patient or authorized representative who has indicated his/her understanding and acceptance.     Dental advisory given  Plan Discussed with: CRNA  Anesthesia Plan Comments:         Anesthesia Quick Evaluation

## 2019-03-23 NOTE — Anesthesia Postprocedure Evaluation (Signed)
Anesthesia Post Note  Patient: Brian Jarvis  Procedure(s) Performed: LEFT - SIDED LUMBAR 5 - SACRUM 1 MICRODISECTOMY (Left )     Patient location during evaluation: PACU Anesthesia Type: General Level of consciousness: awake and alert Pain management: pain level controlled Vital Signs Assessment: post-procedure vital signs reviewed and stable Respiratory status: spontaneous breathing, nonlabored ventilation and respiratory function stable Cardiovascular status: blood pressure returned to baseline and stable Postop Assessment: no apparent nausea or vomiting Anesthetic complications: no    Last Vitals:  Vitals:   03/23/19 1531 03/23/19 1546  BP: 140/79 127/72  Pulse: 77 64  Resp: 15 16  Temp:    SpO2: 96% 95%    Last Pain:  Vitals:   03/23/19 1531  TempSrc:   PainSc: Asleep                 Breeze Berringer,W. EDMOND

## 2019-03-24 MED FILL — Thrombin For Soln Kit 20000 Unit: CUTANEOUS | Qty: 1 | Status: AC

## 2020-02-24 ENCOUNTER — Emergency Department (HOSPITAL_COMMUNITY): Payer: No Typology Code available for payment source

## 2020-02-24 ENCOUNTER — Emergency Department (HOSPITAL_COMMUNITY)
Admission: EM | Admit: 2020-02-24 | Discharge: 2020-02-24 | Disposition: A | Discharge status: Home / Self Care | Payer: No Typology Code available for payment source | Attending: Emergency Medicine | Admitting: Emergency Medicine

## 2020-02-24 ENCOUNTER — Other Ambulatory Visit: Payer: Self-pay

## 2020-02-24 ENCOUNTER — Encounter (HOSPITAL_COMMUNITY): Payer: Self-pay

## 2020-02-24 DIAGNOSIS — Y92410 Unspecified street and highway as the place of occurrence of the external cause: Secondary | ICD-10-CM | POA: Insufficient documentation

## 2020-02-24 DIAGNOSIS — S161XXA Strain of muscle, fascia and tendon at neck level, initial encounter: Secondary | ICD-10-CM | POA: Diagnosis not present

## 2020-02-24 DIAGNOSIS — F17210 Nicotine dependence, cigarettes, uncomplicated: Secondary | ICD-10-CM | POA: Diagnosis not present

## 2020-02-24 DIAGNOSIS — M5459 Other low back pain: Secondary | ICD-10-CM | POA: Diagnosis not present

## 2020-02-24 DIAGNOSIS — M545 Low back pain, unspecified: Secondary | ICD-10-CM

## 2020-02-24 DIAGNOSIS — S199XXA Unspecified injury of neck, initial encounter: Secondary | ICD-10-CM | POA: Diagnosis present

## 2020-02-24 MED ORDER — HYDROCODONE-ACETAMINOPHEN 5-325 MG PO TABS: 1.0000 | ORAL_TABLET | ORAL | 0 refills | Status: AC | PRN

## 2020-02-24 MED ORDER — METHOCARBAMOL 500 MG PO TABS: 500.0000 mg | ORAL_TABLET | Freq: Three times a day (TID) | ORAL | 0 refills | Status: AC | PRN

## 2020-02-24 MED ORDER — HYDROCODONE-ACETAMINOPHEN 5-325 MG PO TABS
1.0000 | ORAL_TABLET | Freq: Once | ORAL | Status: AC
  Administered 2020-02-24: 1 via ORAL
  Filled 2020-02-24: qty 1
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
## 2022-10-10 ENCOUNTER — Other Ambulatory Visit: Payer: Self-pay

## 2022-10-10 ENCOUNTER — Emergency Department (HOSPITAL_BASED_OUTPATIENT_CLINIC_OR_DEPARTMENT_OTHER)
Admission: EM | Admit: 2022-10-10 | Discharge: 2022-10-10 | Disposition: A | Discharge status: Home / Self Care | Payer: BC Managed Care – PPO | Attending: Emergency Medicine | Admitting: Emergency Medicine

## 2022-10-10 ENCOUNTER — Emergency Department (HOSPITAL_BASED_OUTPATIENT_CLINIC_OR_DEPARTMENT_OTHER): Payer: BC Managed Care – PPO

## 2022-10-10 DIAGNOSIS — Y9301 Activity, walking, marching and hiking: Secondary | ICD-10-CM | POA: Diagnosis not present

## 2022-10-10 DIAGNOSIS — Y92512 Supermarket, store or market as the place of occurrence of the external cause: Secondary | ICD-10-CM | POA: Diagnosis not present

## 2022-10-10 DIAGNOSIS — S0990XA Unspecified injury of head, initial encounter: Secondary | ICD-10-CM | POA: Diagnosis present

## 2022-10-10 DIAGNOSIS — S0101XA Laceration without foreign body of scalp, initial encounter: Secondary | ICD-10-CM | POA: Insufficient documentation

## 2022-10-10 DIAGNOSIS — S0083XA Contusion of other part of head, initial encounter: Secondary | ICD-10-CM

## 2022-10-10 MED ORDER — KETOROLAC TROMETHAMINE 15 MG/ML IJ SOLN
15.0000 mg | Freq: Once | INTRAMUSCULAR | Status: AC
  Administered 2022-10-10: 15 mg via INTRAMUSCULAR
  Filled 2022-10-10: qty 1

## 2022-10-10 NOTE — ED Notes (Signed)
Pt to the RN station, stating that he needs to leave.  EDP Alex made aware, to desk to speak with pt.
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
## 2022-10-16 ENCOUNTER — Encounter (HOSPITAL_BASED_OUTPATIENT_CLINIC_OR_DEPARTMENT_OTHER): Payer: Self-pay | Admitting: Emergency Medicine

## 2022-10-16 ENCOUNTER — Emergency Department (HOSPITAL_BASED_OUTPATIENT_CLINIC_OR_DEPARTMENT_OTHER)
Admission: EM | Admit: 2022-10-16 | Discharge: 2022-10-16 | Disposition: A | Discharge status: Home / Self Care | Payer: BC Managed Care – PPO | Attending: Emergency Medicine | Admitting: Emergency Medicine

## 2022-10-16 ENCOUNTER — Other Ambulatory Visit: Payer: Self-pay

## 2022-10-16 DIAGNOSIS — R519 Headache, unspecified: Secondary | ICD-10-CM | POA: Diagnosis present

## 2022-10-16 DIAGNOSIS — S060X0D Concussion without loss of consciousness, subsequent encounter: Secondary | ICD-10-CM | POA: Insufficient documentation
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
## 2022-10-16 NOTE — Discharge Instructions (Signed)
I have given you information for the concussion clinic.  Please call and schedule an appointment to follow-up with them.  For any concerning symptoms return to the emergency room.
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
## 2023-12-04 ENCOUNTER — Emergency Department (HOSPITAL_COMMUNITY): Payer: Self-pay

## 2023-12-04 ENCOUNTER — Emergency Department (HOSPITAL_COMMUNITY)
Admission: EM | Admit: 2023-12-04 | Discharge: 2023-12-04 | Discharge status: Against Medical Advice | Payer: Self-pay | Attending: Emergency Medicine | Admitting: Emergency Medicine

## 2023-12-04 DIAGNOSIS — Z5321 Procedure and treatment not carried out due to patient leaving prior to being seen by health care provider: Secondary | ICD-10-CM | POA: Insufficient documentation

## 2023-12-04 DIAGNOSIS — X509XXA Other and unspecified overexertion or strenuous movements or postures, initial encounter: Secondary | ICD-10-CM | POA: Insufficient documentation

## 2023-12-04 DIAGNOSIS — Y9301 Activity, walking, marching and hiking: Secondary | ICD-10-CM | POA: Insufficient documentation

## 2023-12-04 DIAGNOSIS — M79661 Pain in right lower leg: Secondary | ICD-10-CM | POA: Insufficient documentation

## 2023-12-04 NOTE — ED Triage Notes (Signed)
 Was walking yesterday and felt a pop in his lower right leg. Now cannot walk/put pressure on the extremity.
